# Patient Record
Sex: Female | Born: 1940 | Race: White | Hispanic: No | Smoking: Never smoker
Health system: Southern US, Community
[De-identification: ages and names within clinical notes are randomized; demographics above are authoritative.]

## PROBLEM LIST (undated history)

## (undated) DIAGNOSIS — E079 Disorder of thyroid, unspecified: Secondary | ICD-10-CM

---

## 2019-06-07 ENCOUNTER — Emergency Department (HOSPITAL_BASED_OUTPATIENT_CLINIC_OR_DEPARTMENT_OTHER)
Admission: EM | Admit: 2019-06-07 | Discharge: 2019-06-07 | Disposition: A | Discharge status: Home / Self Care | Payer: Medicare Other | Attending: Emergency Medicine | Admitting: Emergency Medicine

## 2019-06-07 ENCOUNTER — Emergency Department (HOSPITAL_BASED_OUTPATIENT_CLINIC_OR_DEPARTMENT_OTHER): Payer: Medicare Other

## 2019-06-07 ENCOUNTER — Other Ambulatory Visit: Payer: Self-pay

## 2019-06-07 ENCOUNTER — Encounter (HOSPITAL_BASED_OUTPATIENT_CLINIC_OR_DEPARTMENT_OTHER): Payer: Self-pay | Admitting: Emergency Medicine

## 2019-06-07 DIAGNOSIS — M25572 Pain in left ankle and joints of left foot: Secondary | ICD-10-CM | POA: Insufficient documentation

## 2019-06-07 DIAGNOSIS — M79672 Pain in left foot: Secondary | ICD-10-CM | POA: Diagnosis present

## 2019-06-07 HISTORY — DX: Disorder of thyroid, unspecified: E07.9

## 2019-06-07 MED ORDER — IBUPROFEN 600 MG PO TABS: 600.0000 mg | ORAL_TABLET | Freq: Four times a day (QID) | ORAL | 0 refills | Status: AC | PRN

## 2019-06-07 NOTE — ED Triage Notes (Signed)
L foot and ankle pain and swelling x 1 week. Denies injury.

## 2019-06-07 NOTE — ED Provider Notes (Signed)
[facility] EMERGENCY DEPARTMENT Provider Note   CSN: [id number] Arrival date & time: 06/07/19  1101     History Chief Complaint  Patient presents with  . Ankle Pain    Catherine Quinn is a 79 y.o. female.  HPI Patient presents with left foot pain.  Has had for around the last week.  States there is some swelling.  Started on the outside of the foot on the left but now more on the medial aspect.  No injury.  No fevers.  No erythema.  No tenderness or swelling up the leg.  States she has had gout before but this feels different.    Past Medical History:  Diagnosis Date  . Thyroid disease     There are no problems to display for this patient.   History reviewed. No pertinent surgical history.   OB History   No obstetric history on file.     History reviewed. No pertinent family history.  Social History   Tobacco Use  . Smoking status: Never Smoker  . Smokeless tobacco: Never Used  Substance Use Topics  . Alcohol use: Yes  . Drug use: Not on file    Home Medications Prior to Admission medications   Medication Sig Start Date End Date Taking? Authorizing Provider  ibandronate (BONIVA) 150 MG tablet Take by mouth. 06/02/19  Yes [provider]  ibuprofen (ADVIL) 600 MG tablet Take 1 tablet (600 mg total) by mouth every 6 (six) hours as needed. 06/07/19   Davonna Belling, MD    Allergies    Patient has no known allergies.  Review of Systems   Review of Systems  Constitutional: Negative for appetite change.  Respiratory: Negative for shortness of breath.   Cardiovascular: Negative for chest pain.  Gastrointestinal: Negative for abdominal pain.  Musculoskeletal: Negative for back pain.       Left foot pain.  Neurological: Negative for weakness and numbness.    Physical Exam Updated Vital Signs BP 125/75 (BP Location: Right Arm)   Pulse 67   Temp 98.6 F (37 C) (Oral)   Resp 16   Ht 5\' 3"  (1.6 m)   Wt 65.8 kg   SpO2 98%   BMI 25.69  kg/m   Physical Exam Vitals reviewed.  Musculoskeletal:     Comments: Some edema lateral left foot/ankle.  No tenderness here however.  There is tenderness medially over midfoot without skin changes or deformity.  Good flexion and extension at the foot and toes.  No pain with movement of the ankle.  No skin changes.  Skin:    General: Skin is warm.     Capillary Refill: Capillary refill takes less than 2 seconds.  Neurological:     Mental Status: She is alert. Mental status is at baseline.     ED Results / Procedures / Treatments   Labs (all labs ordered are listed, but only abnormal results are displayed) Labs Reviewed - No data to display  EKG None  Radiology DG Foot Complete Left  Result Date: 06/07/2019 CLINICAL DATA:  Medial left foot pain 4 1 week. EXAM: LEFT FOOT - COMPLETE 3+ VIEW COMPARISON:  Left ankle 12/27/2018 FINDINGS: Subacute to chronic fracture deformity involving the base of the fifth metatarsal bone hallux valgus deformity is noted with degenerative changes at the first MTP joint. The overlying soft tissue swelling noted. No acute fractures. IMPRESSION: 1. There is a subacute to chronic base of fifth metatarsal bone fracture which is new from 12/27/2018 2.  Hallux valgus deformity with degenerative changes at the first MTP joint. Electronically Signed   By: Signa Kell M.D.   On: 06/07/2019 12:34    Procedures Procedures (including critical care time)  Medications Ordered in ED Medications - No data to display  ED Course  I have reviewed the triage vital signs and the nursing notes.  Pertinent labs & imaging results that were available during my care of the patient were reviewed by me and considered in my medical decision making (see chart for details).    MDM Rules/Calculators/A&P                      Patient with left foot pain for the last week.  Tenderness medially over midfoot.  X-ray reassuring.  Did have likely old fracture but not tender  laterally where the finding was.  Will give patient walking boot.  Have follow-up with podiatry, the patient is seen before Final Clinical Impression(s) / ED Diagnoses Final diagnoses:  Left foot pain    Rx / DC Orders ED Discharge Orders         Ordered    ibuprofen (ADVIL) 600 MG tablet  Every 6 hours PRN     06/07/19 1311           Benjiman Core, MD 06/07/19 1328

## 2021-07-02 IMAGING — DX DG FOOT COMPLETE 3+V*L*
3 series · 3 of 3 positions shown · non-contrast
Comparison: Left ankle 12/27/2018

CLINICAL DATA: Medial left foot pain 4 1 week.

EXAM:
LEFT FOOT - COMPLETE 3+ VIEW

[foot ap]
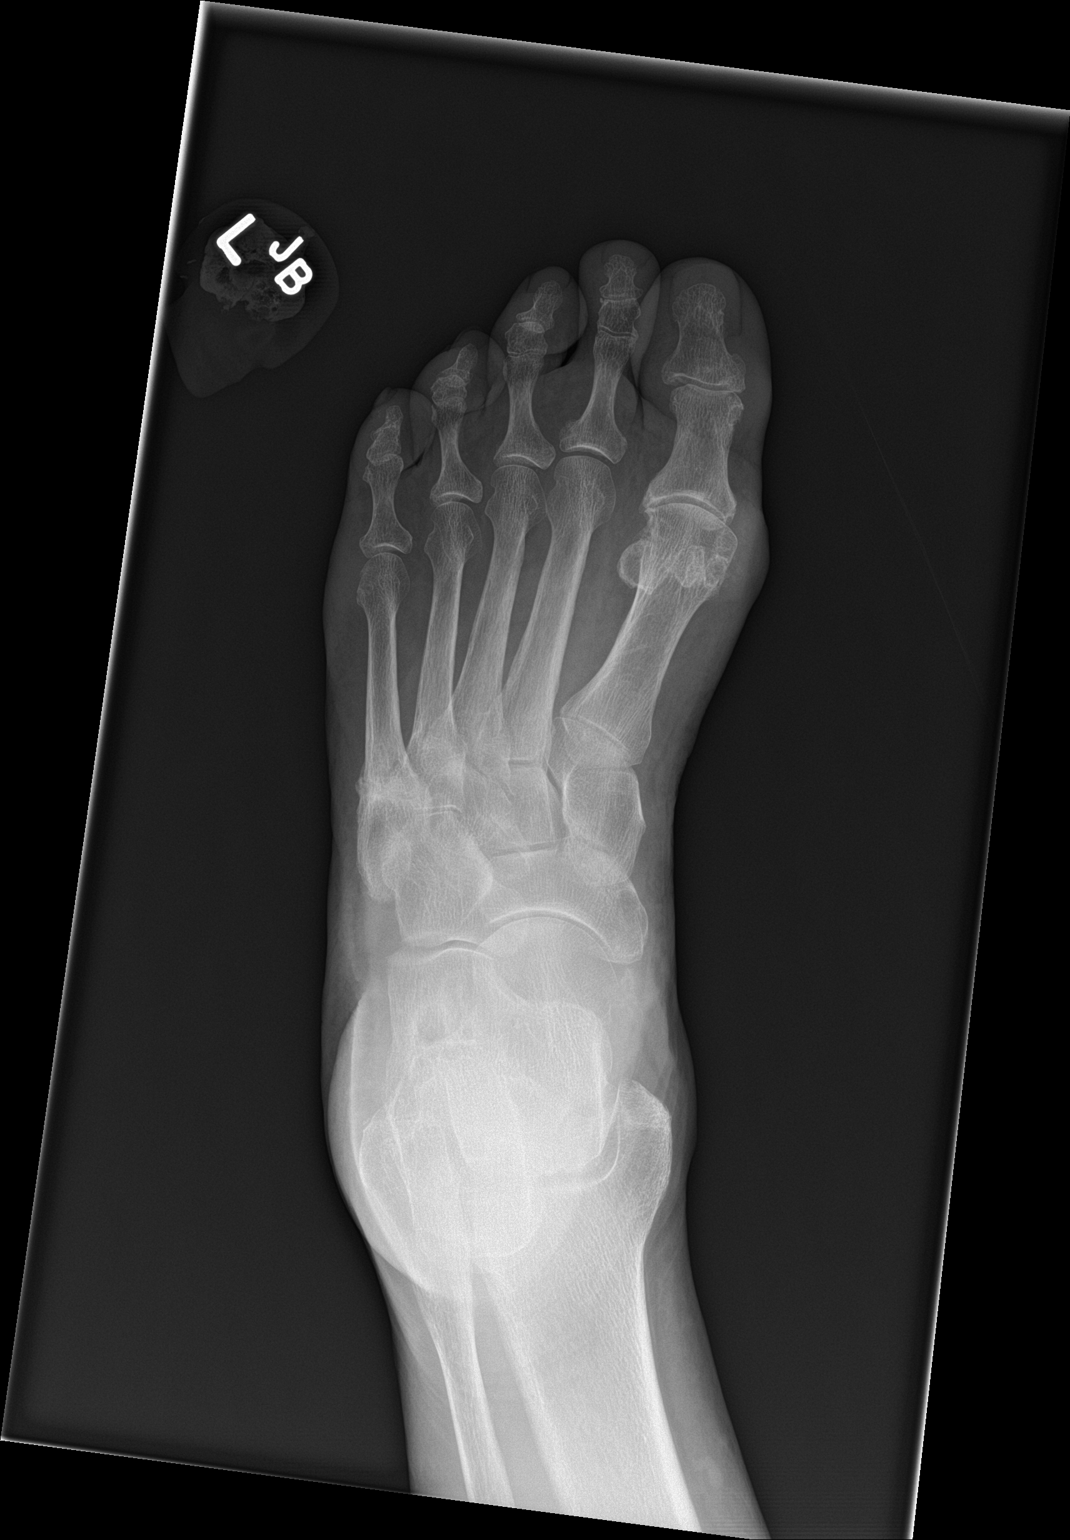

[foot obl]
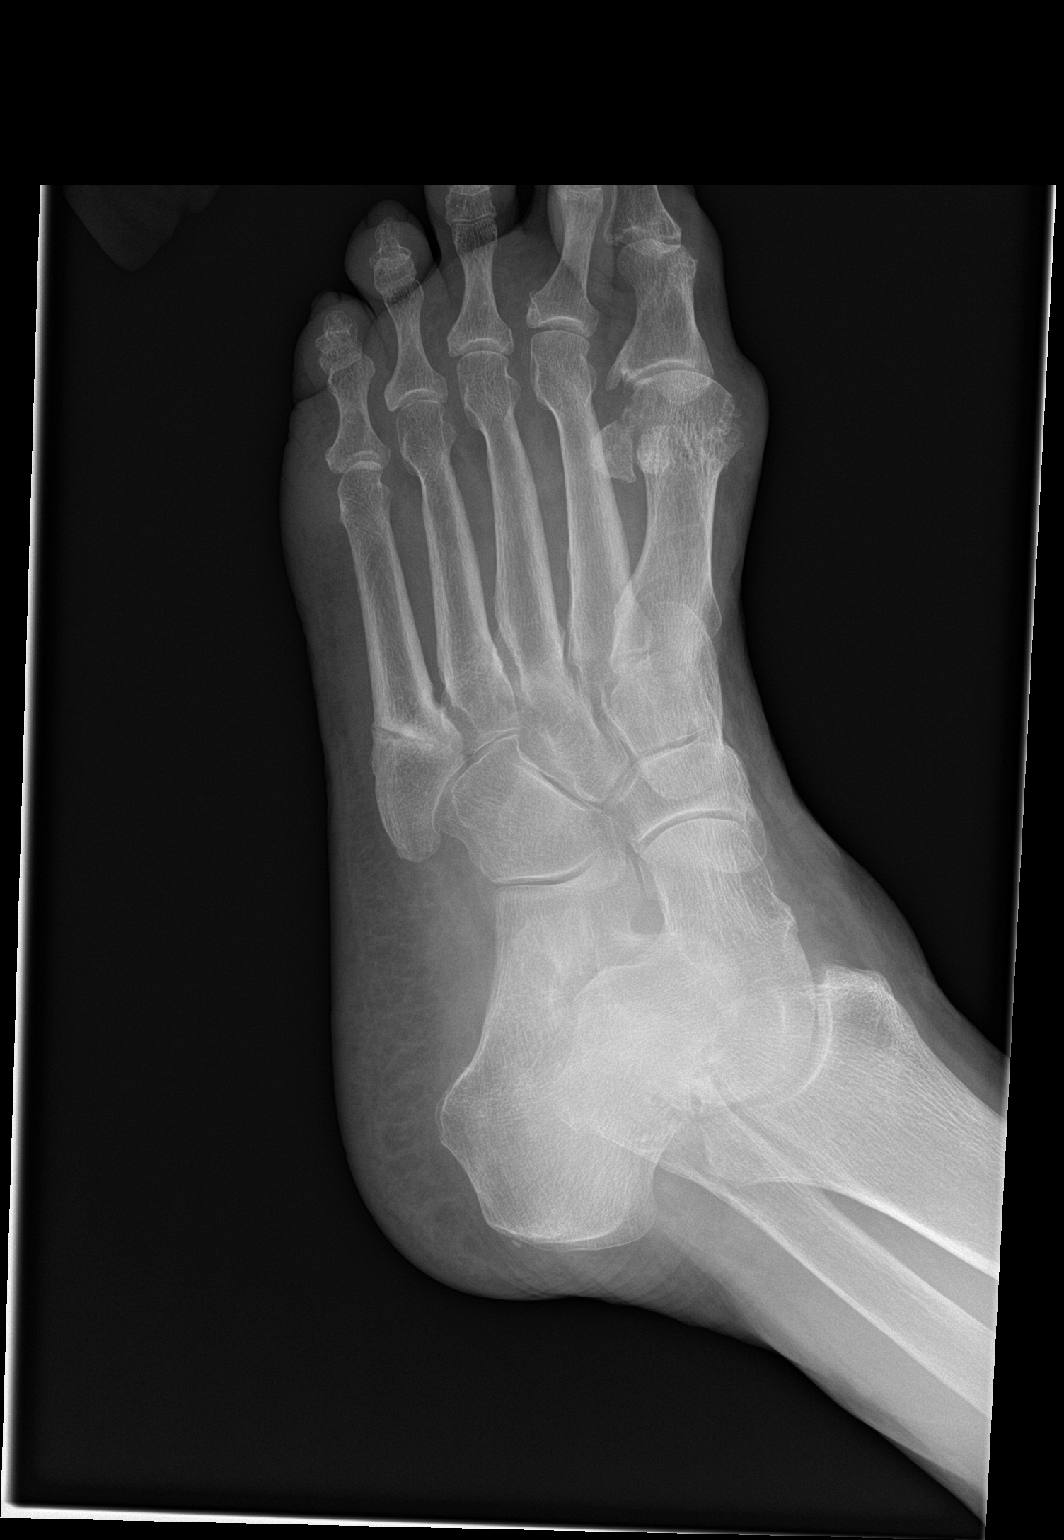

[foot lat]
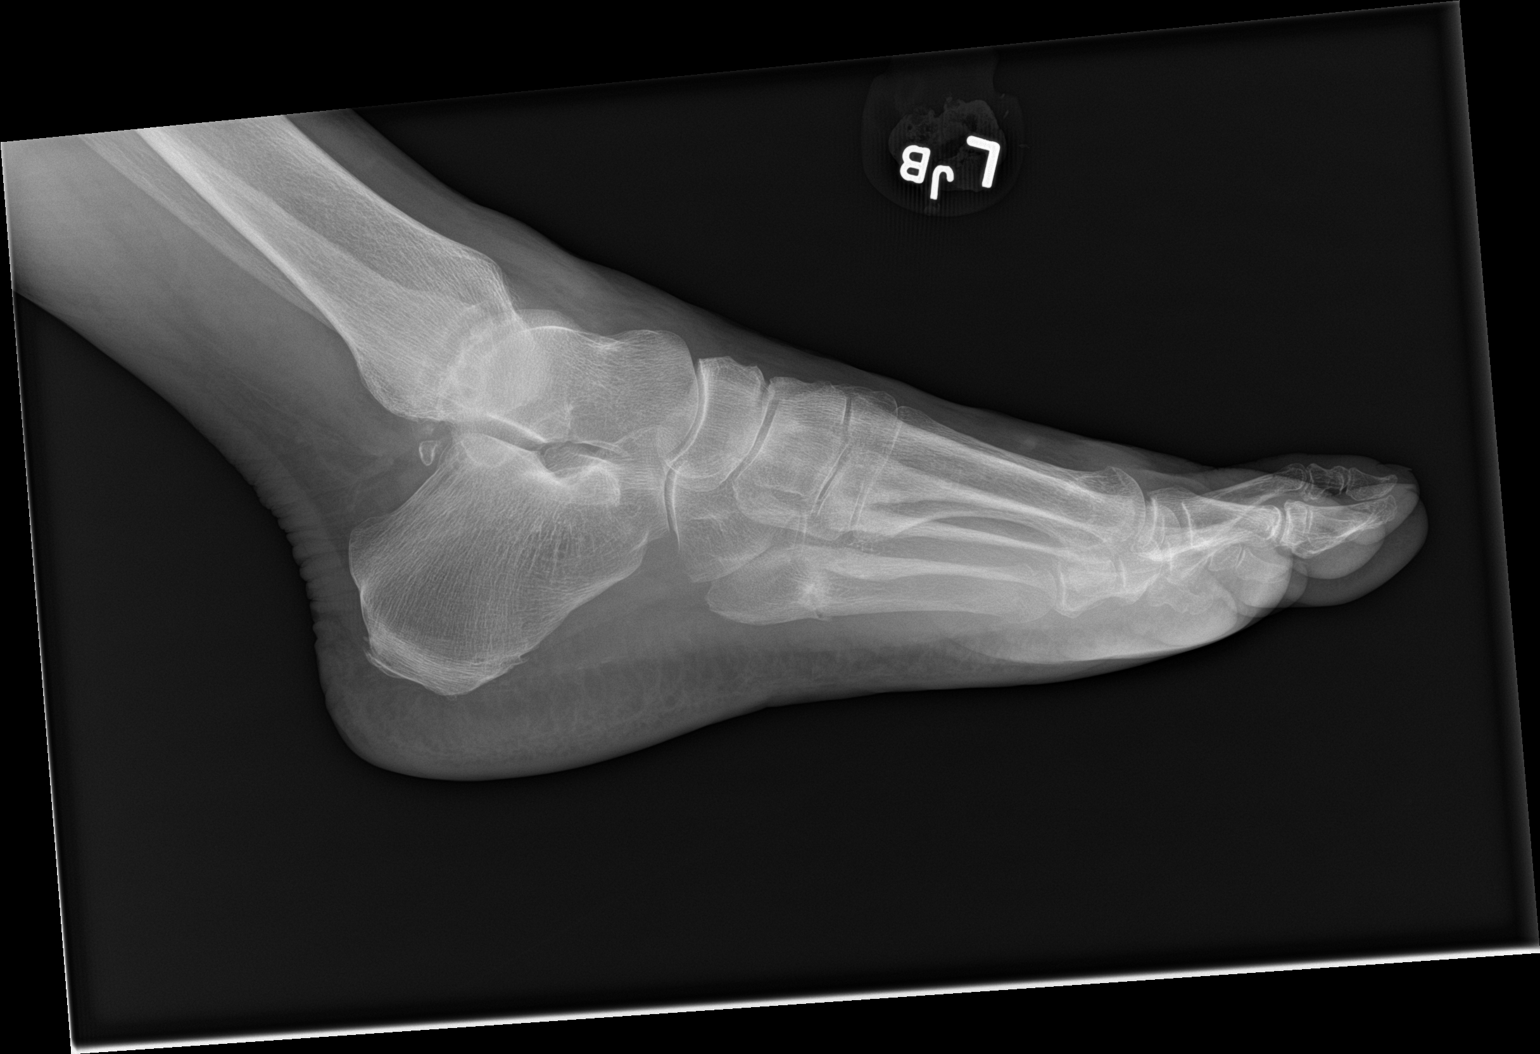

[3 of 3 positions shown; findings below may reference images not displayed]

FINDINGS: Subacute to chronic fracture deformity involving the base of the
fifth metatarsal bone hallux valgus deformity is noted with
degenerative changes at the first MTP joint. The overlying soft
tissue swelling noted. No acute fractures.
IMPRESSION: 1. There is a subacute to chronic base of fifth metatarsal bone
fracture which is new from 12/27/2018
2. Hallux valgus deformity with degenerative changes at the first
MTP joint.
